# Patient Record
Sex: Male | Born: 1955 | Marital: Married | Smoking: Never smoker
Health system: Southern US, Community
[De-identification: ages and names within clinical notes are randomized; demographics above are authoritative.]

## PROBLEM LIST (undated history)

## (undated) HISTORY — PX: TONSILLECTOMY: SUR1361

## (undated) HISTORY — PX: APPENDECTOMY: SHX54

## (undated) HISTORY — PX: HERNIA REPAIR: SHX51

## (undated) HISTORY — PX: VASECTOMY: SHX75

## (undated) HISTORY — PX: KNEE SURGERY: SHX244

## (undated) HISTORY — PX: CHOLECYSTECTOMY: SHX55

---

## 2005-10-27 ENCOUNTER — Ambulatory Visit: Payer: Self-pay | Admitting: Internal Medicine

## 2005-12-16 ENCOUNTER — Inpatient Hospital Stay: Payer: Self-pay | Admitting: General Surgery

## 2005-12-16 ENCOUNTER — Other Ambulatory Visit: Payer: Self-pay

## 2006-11-24 IMAGING — US ABDOMEN ULTRASOUND
1 series · 17 of 25 positions shown · non-contrast
Comparison: none

REASON FOR EXAM: Pancreatitis
COMMENTS:

[Series 1: abdomen ultrasound · 17 of 140 slices shown]
[im 1/140]
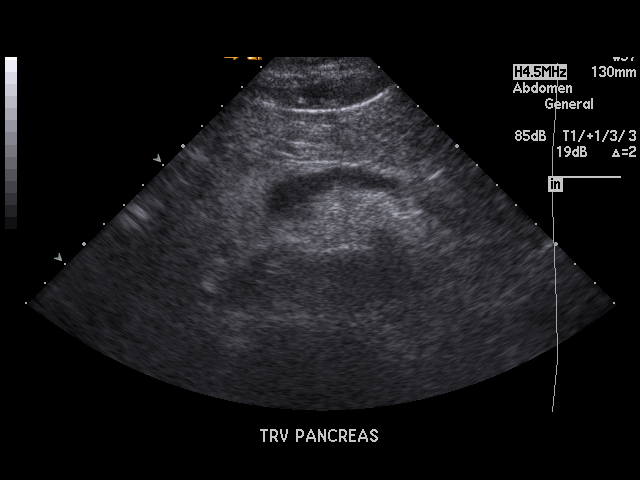
[im 12/140]
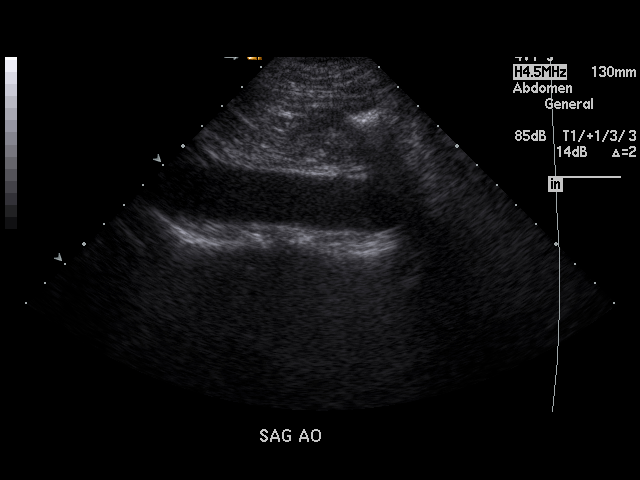
[im 18/140]
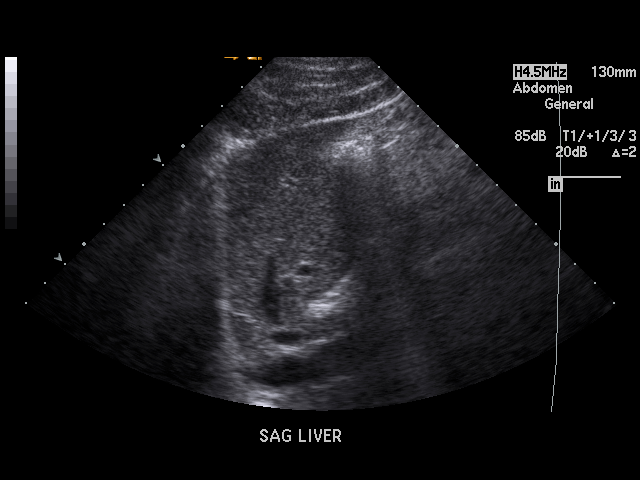
[im 29/140]
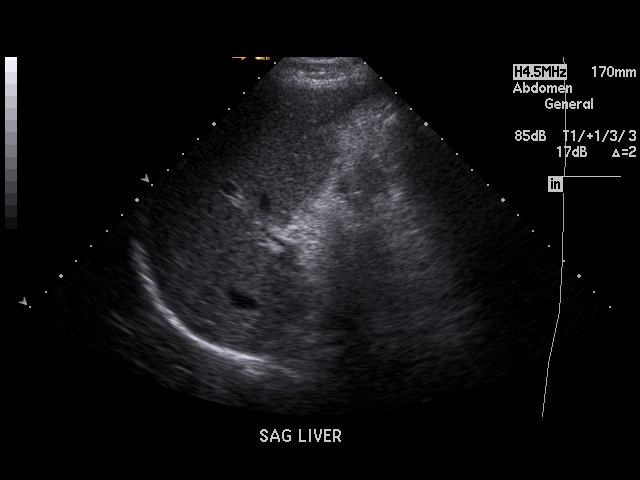
[im 35/140]
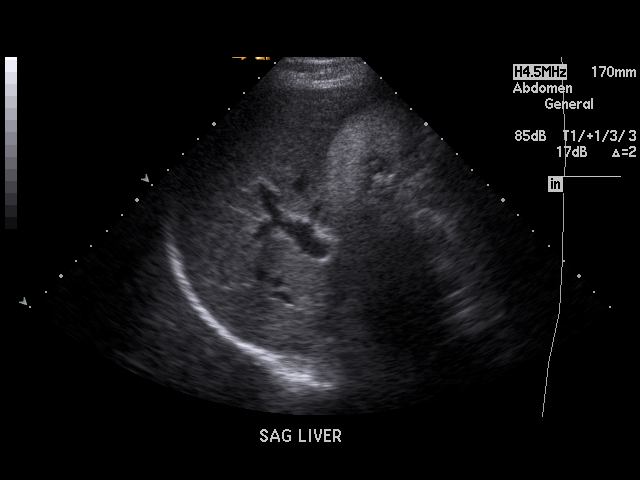
[im 47/140]
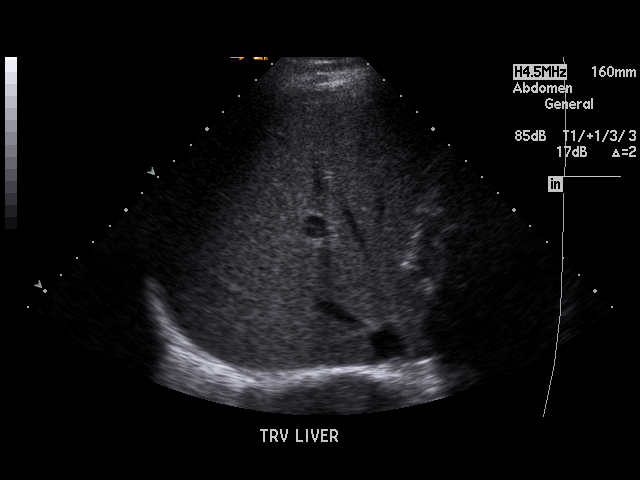
[im 53/140]
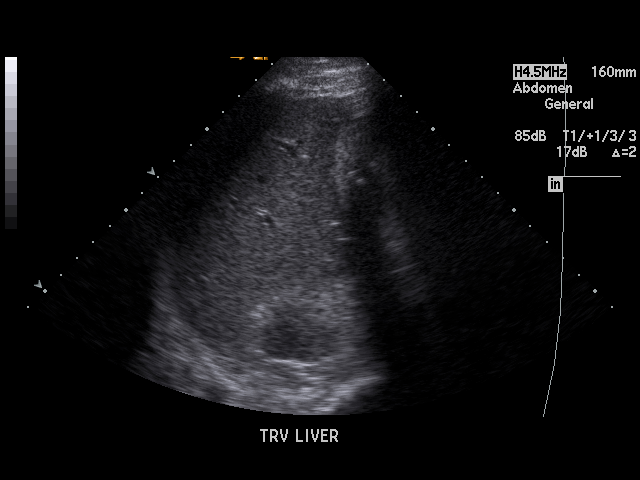
[im 64/140]
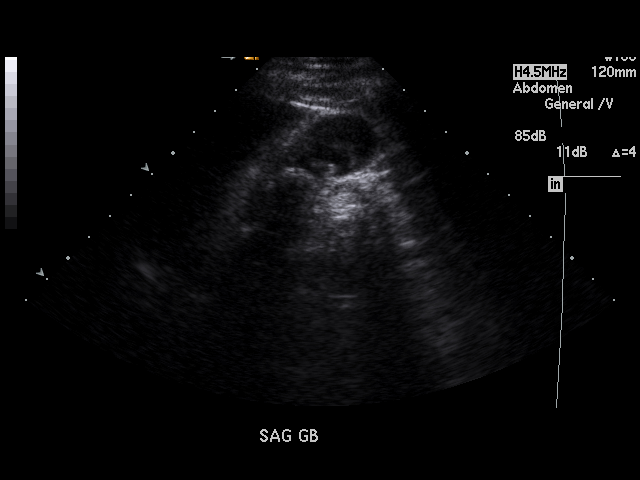
[im 70/140]
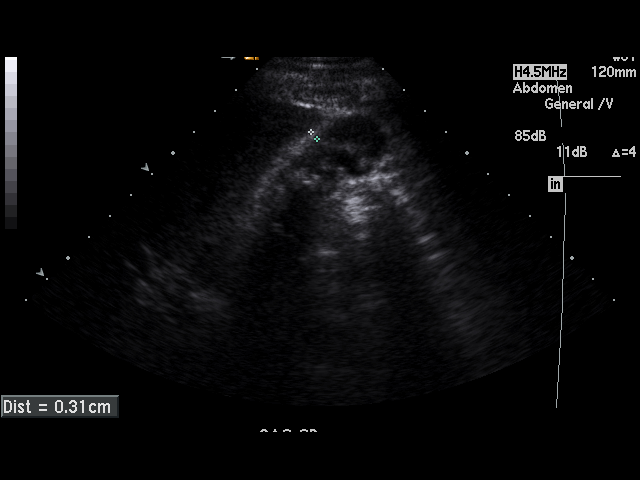
[im 76/140]
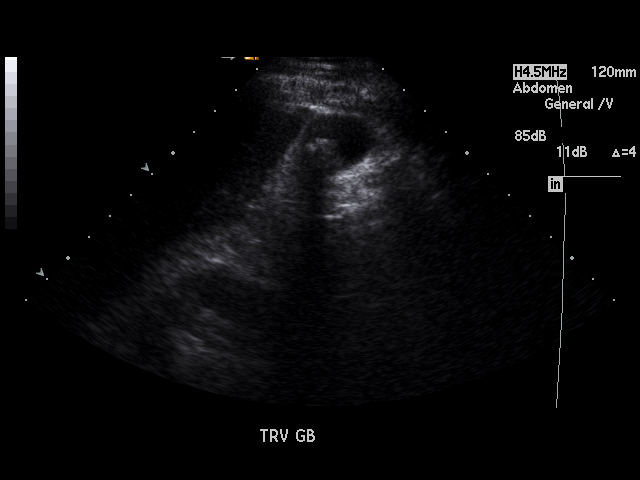
[im 87/140]
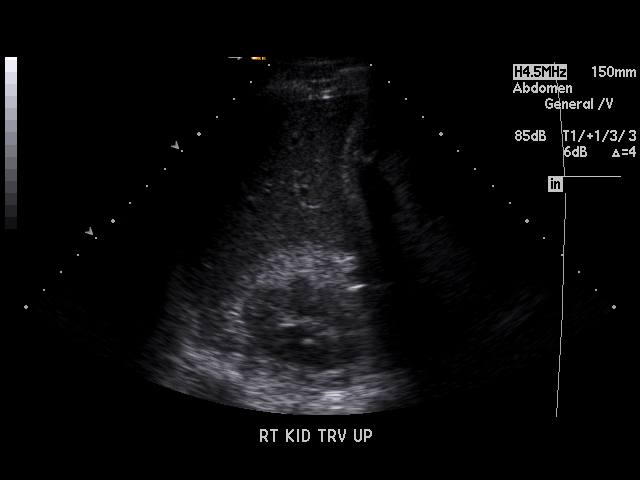
[im 93/140]
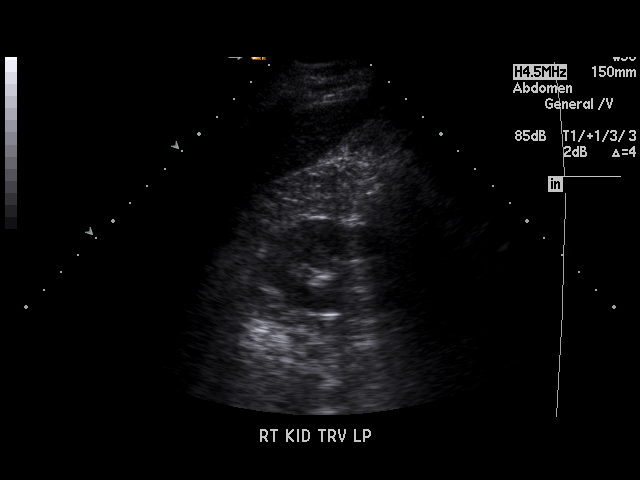
[im 105/140]
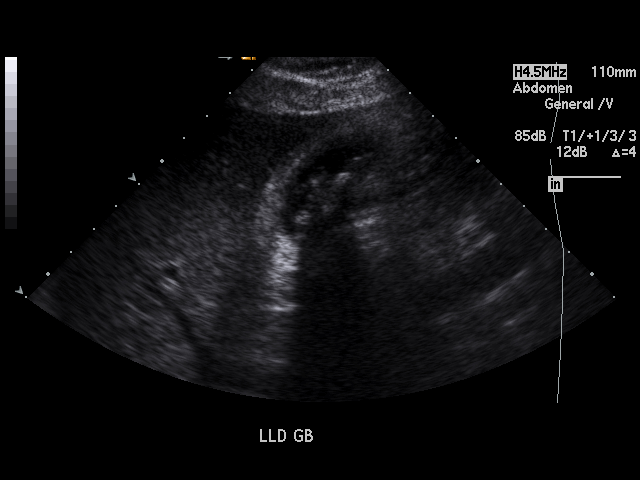
[im 111/140]
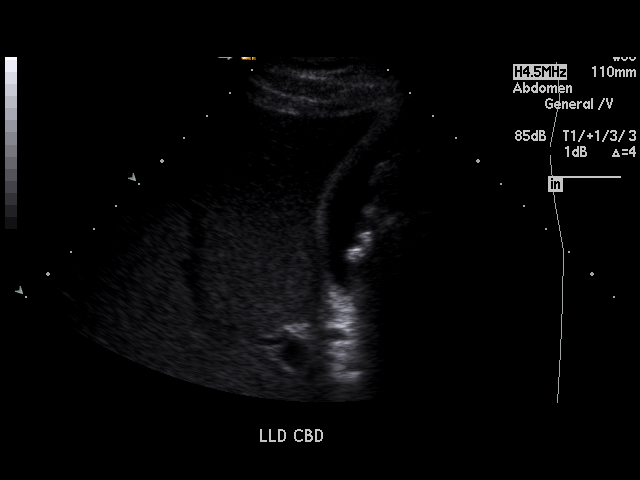
[im 122/140]
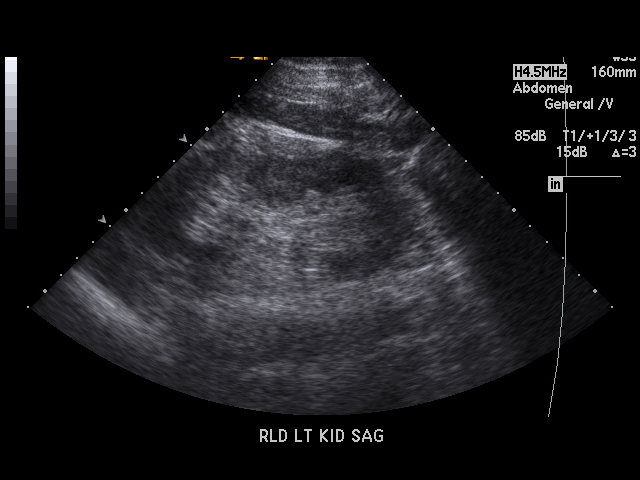
[im 128/140]
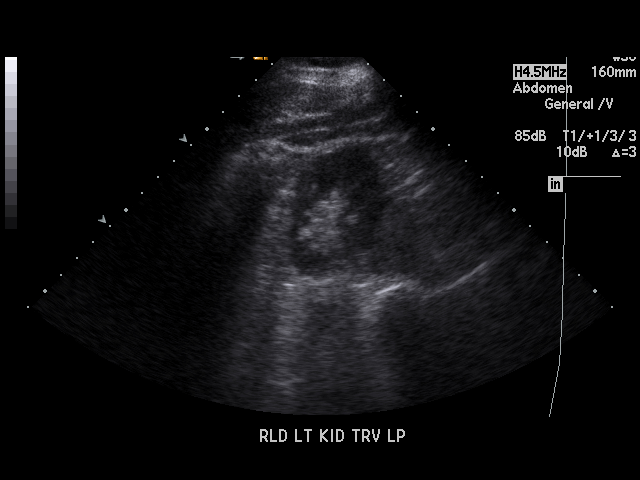
[im 140/140]
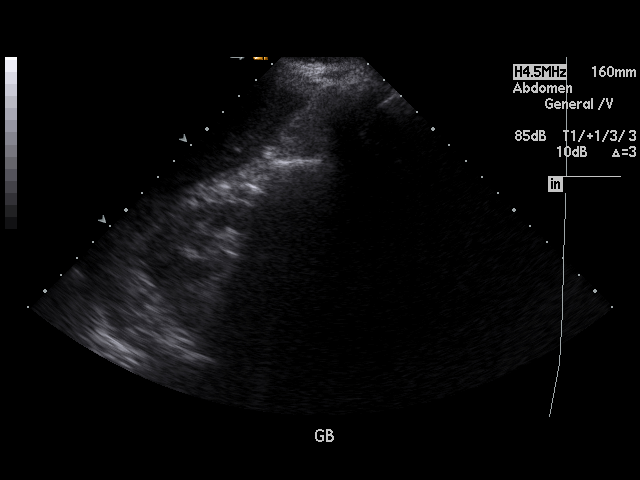

[17 of 25 positions shown; findings below may reference images not displayed]

PROCEDURE:     US  - US ABDOMEN GENERAL SURVEY  - December 18, 2005 [DATE]

RESULT:          The patient has known pancreatitis and is being evaluated
now for possible gallstones.

The pancreatic head appears normal in echotexture, measuring 1.9 cm in
diameter.  The observed portions of the pancreatic body are grossly normal,
but gas does limit the study.  The gallbladder is adequately distended and
contains echogenic mobile shadowing gallstones.  The gallbladder wall is
thickened at 3.1 mm.  No pericholecystic fluid is demonstrated.  The patient
did not exhibit a positive sonographic Murphy's sign.

The common bile duct is normal at 5.1 mm in diameter.  No stones are
identified within the duct.  The RIGHT kidney demonstrates an approximately
1.7 cm diameter simple-appearing cyst.  No hydronephrosis is seen on the
RIGHT.  The spleen is normal in size, measuring 10.8 cm in greatest
dimension.  Its echotexture is normal.  The LEFT kidney is normal in size
and echotexture.  No significant ascites is demonstrated.
IMPRESSION: 1.     There are gallstones present.  There is gallbladder wall thickening,
but I see no pericholecystic fluid, and there is no sonographic Murphy's
sign.
2.     The common bile duct is normal at 5.1 mm in diameter, and there is no
evidence of a stone within the duct.
3.     The evaluation of the pancreas is limited.  The pancreatic head is
grossly normal.
4.     There is a probable cyst in the mid pole of the RIGHT kidney.

The findings were called to Dr. Corpuz at the conclusion of the study.

## 2008-05-08 ENCOUNTER — Ambulatory Visit: Payer: Self-pay | Admitting: Gastroenterology

## 2008-11-03 ENCOUNTER — Ambulatory Visit: Payer: Self-pay | Admitting: Family Medicine

## 2009-03-31 DIAGNOSIS — M1909 Primary osteoarthritis, other specified site: Secondary | ICD-10-CM | POA: Insufficient documentation

## 2012-04-25 ENCOUNTER — Ambulatory Visit: Payer: Self-pay | Admitting: Family Medicine

## 2012-04-25 LAB — BASIC METABOLIC PANEL
BUN: 13 mg/dL (ref 4–21)
Creatinine: 1 mg/dL (ref 0.6–1.3)
GLUCOSE: 101 mg/dL
POTASSIUM: 3.9 mmol/L (ref 3.4–5.3)
Sodium: 140 mmol/L (ref 137–147)

## 2012-04-25 LAB — CBC AND DIFFERENTIAL
HEMATOCRIT: 44 % (ref 41–53)
HEMOGLOBIN: 14.7 g/dL (ref 13.5–17.5)
NEUTROS ABS: 4 /uL
PLATELETS: 256 10*3/uL (ref 150–399)
WBC: 6.9 10*3/mL

## 2012-04-25 LAB — HEPATIC FUNCTION PANEL
ALT: 21 U/L (ref 10–40)
AST: 20 U/L (ref 14–40)
Alkaline Phosphatase: 73 U/L (ref 25–125)
BILIRUBIN, TOTAL: 0.4 mg/dL

## 2012-04-25 LAB — TSH: TSH: 1.85 u[IU]/mL (ref 0.41–5.90)

## 2012-11-23 ENCOUNTER — Ambulatory Visit: Payer: Self-pay | Admitting: General Surgery

## 2012-11-23 LAB — CREATININE, SERUM
EGFR (African American): >60
EGFR (Non-African Amer.): >60

## 2013-10-30 IMAGING — CT CT ABD-PELV W/ CM
1 of 2 series · 15 of 32 positions shown, 19 images · non-contrast
Comparison: none

REASON FOR EXAM: LABS 1st Abdominal pain
COMMENTS:

PROCEDURE:     CT  - CT ABDOMEN / PELVIS  W  - November 23, 2012  [DATE]
RESULT:
TECHNIQUE: CT of the abdomen and pelvis is performed with 100 ml of
Dsovue-LPP iodinated intravenous contrast with oral contrast. Images are
reconstructed at 3.0 mm slice thickness in the axial plane.
There is no previous exam for comparison.

[Series 2: soft tissue · axial · 0.72mm/px · z∈[-1004,-528]mm · 15 of 173 slices shown, 19 images]
[im 7/173  soft-tissue]
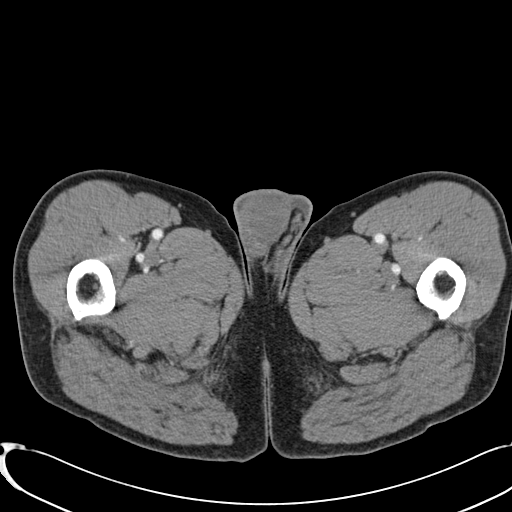
[im 7/173  bone]
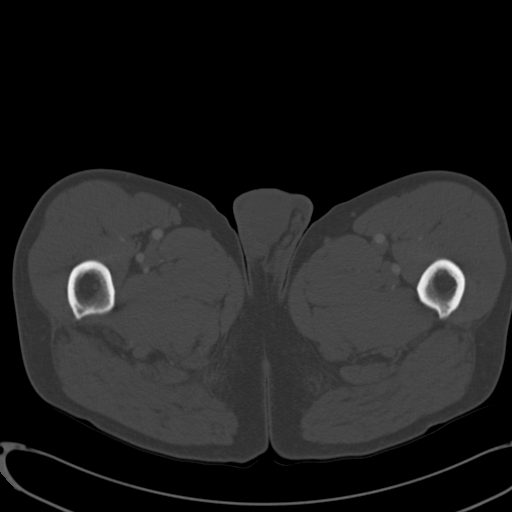
[im 21/173  soft-tissue]
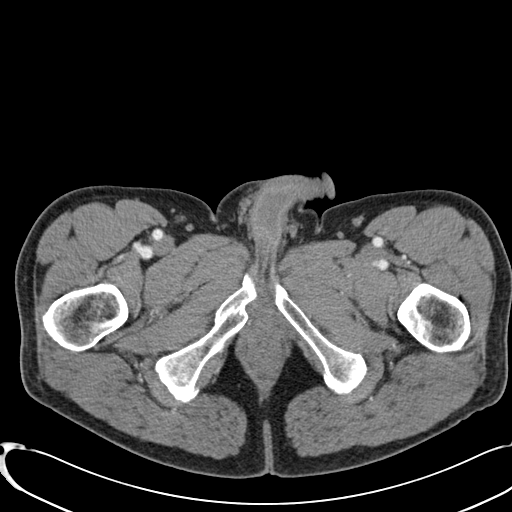
[im 35/173  soft-tissue]
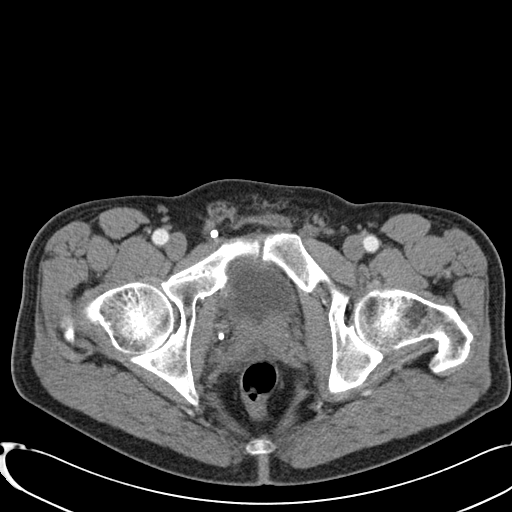
[im 49/173  soft-tissue]
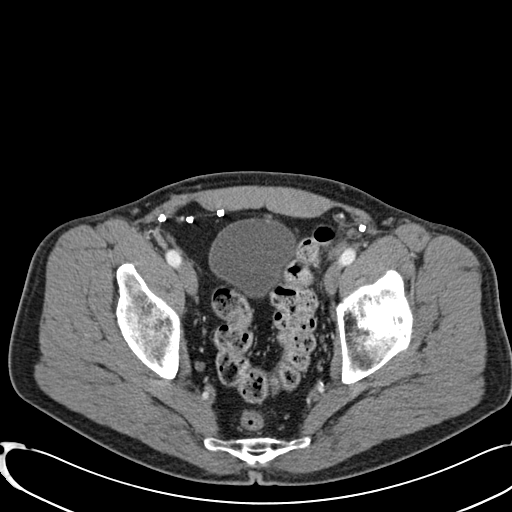
[im 62/173  soft-tissue]
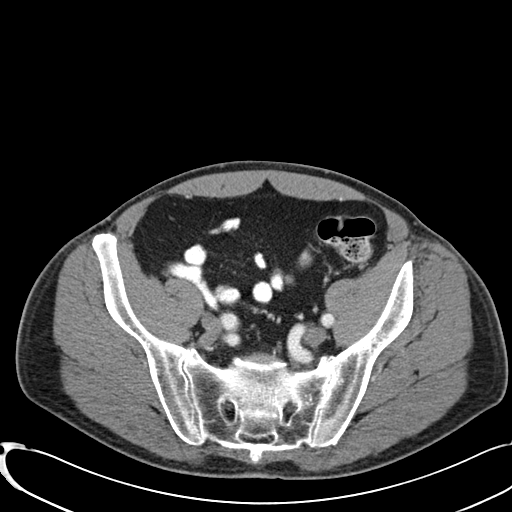
[im 76/173  soft-tissue]
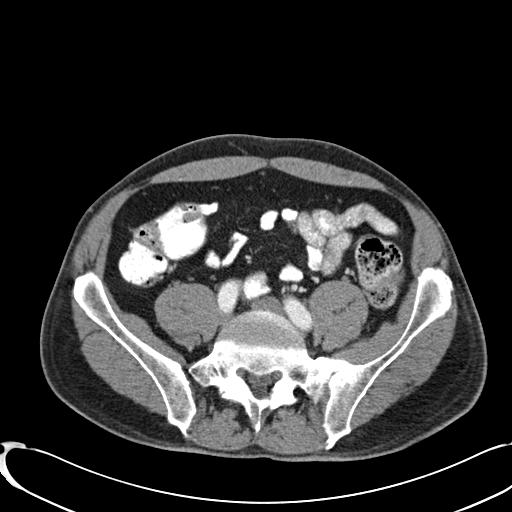
[im 90/173  soft-tissue]
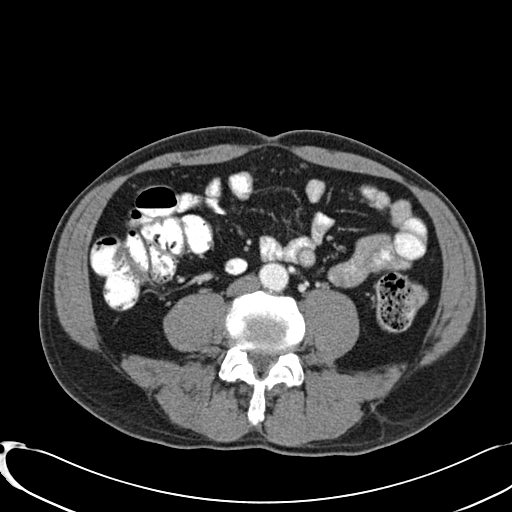
[im 97/173  soft-tissue]
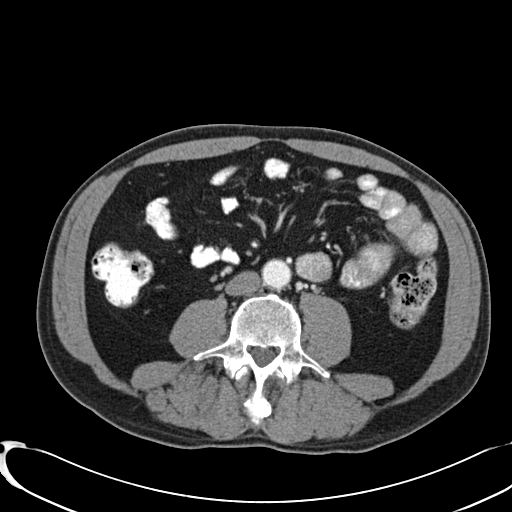
[im 111/173  soft-tissue]
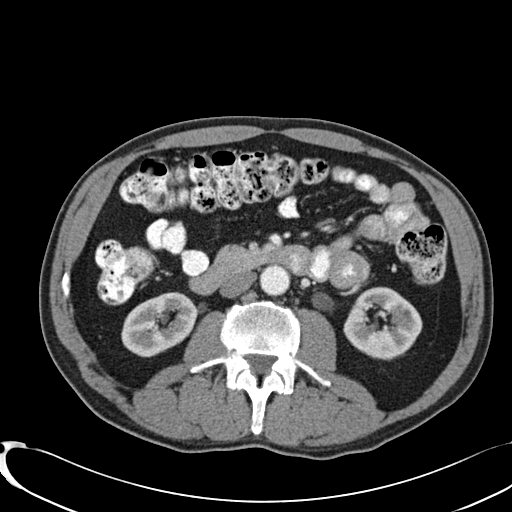
[im 111/173  bone]
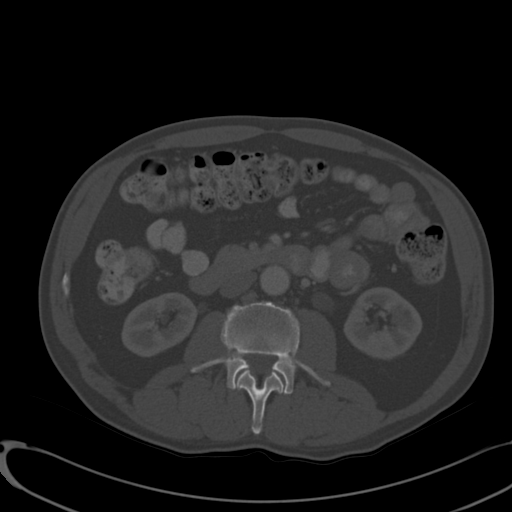
[im 124/173  soft-tissue]
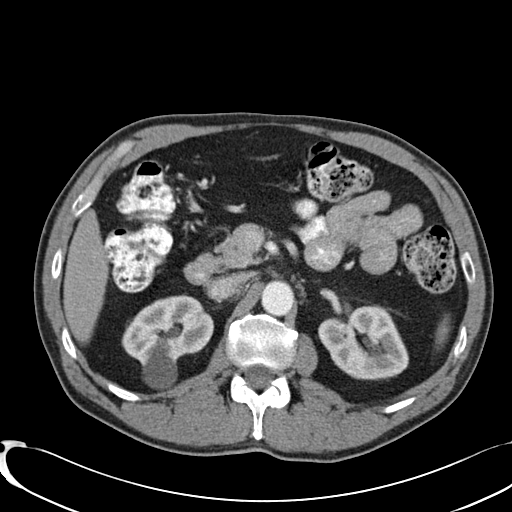
[im 138/173  soft-tissue]
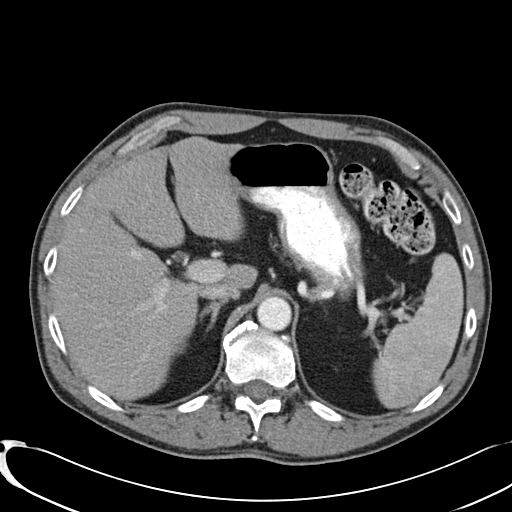
[im 145/173  lung]
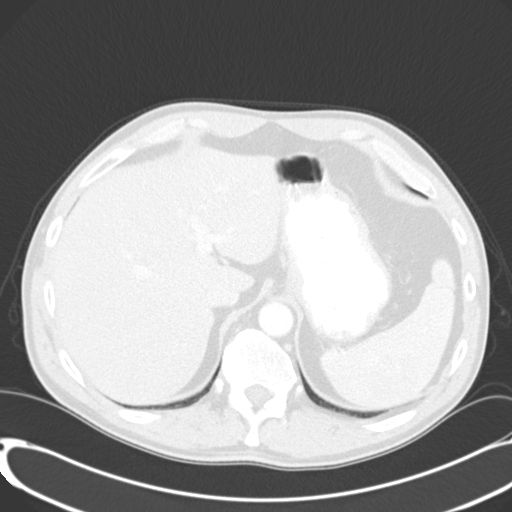
[im 152/173  soft-tissue]
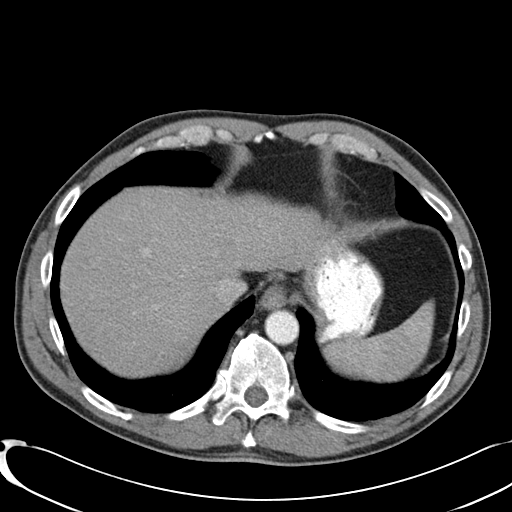
[im 152/173  lung]
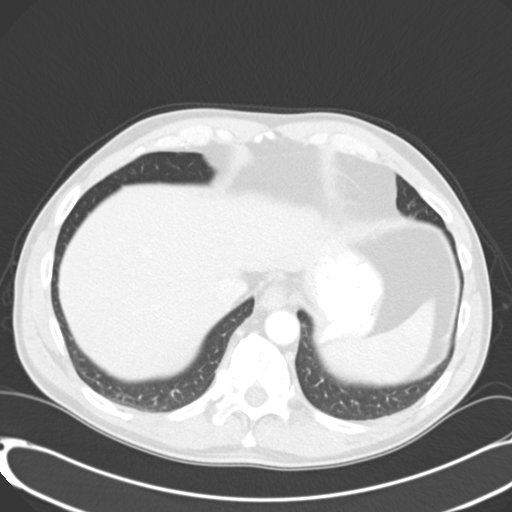
[im 159/173  lung]
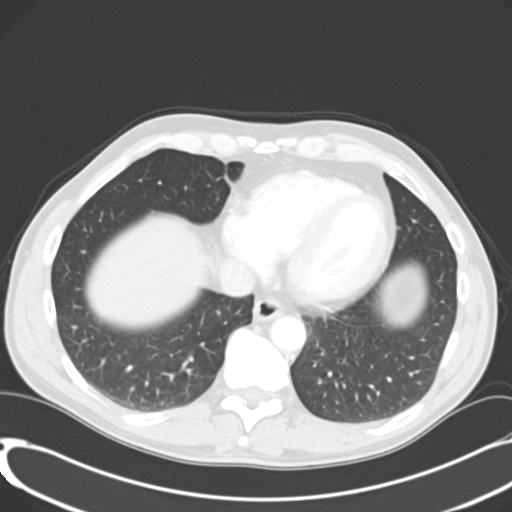
[im 166/173  soft-tissue]
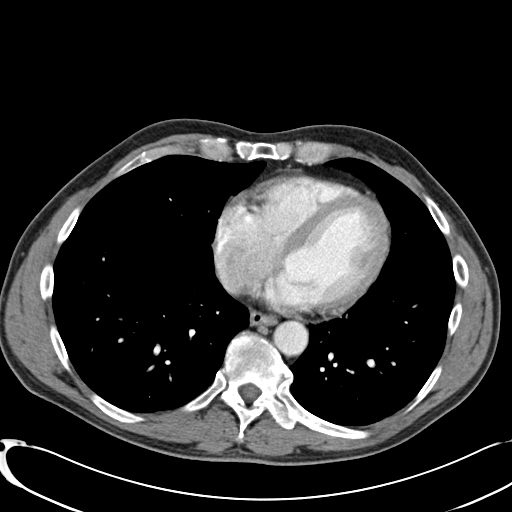
[im 166/173  lung]
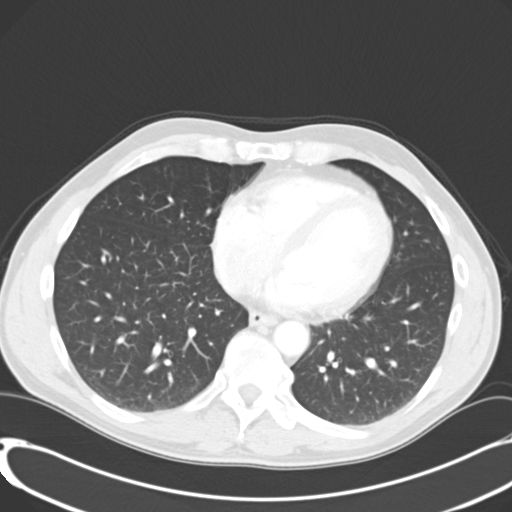

[15 of 32 positions shown; findings below may reference images not displayed]

FINDINGS: Lung base images demonstrate minimal dependent atelectasis. There
is no infiltrate, effusion or pneumothorax. The liver and spleen appear
unremarkable. Cholecystectomy clips are present. The adrenal glands appear
normal. There is a 3.1 cm cyst posteriorly in the upper to mid right kidney.
There is an extrarenal pelvis on the left. The abdominal aorta is normal in
caliber. The spleen and pancreas appear unremarkable. There is no abscess or
ascites. Some scattered colonic diverticulosis is seen. There is no acute
inflammatory process. There is no adenopathy. The urinary bladder and
prostate appear unremarkable. There is a history of appendectomy. Coils from
previous hernia repair are present. The bony structures appear within normal
limits. The abdominal wall appears intact. The stomach shows no wall
thickening or abnormal distention. There is a moderate amount of fecal
material scattered through the colon without obstruction. There is no
thickening of the wall or abnormal distention of the small or large bowel.
IMPRESSION: 1.     No acute abdominal abnormality. No bowel obstruction. No acute
inflammatory process. No adenopathy.
2.     Status post cholecystectomy and appendectomy.
3.     Minimal dependent atelectasis in the lung bases.

[REDACTED]

## 2016-05-24 DIAGNOSIS — Z86010 Personal history of colonic polyps: Secondary | ICD-10-CM | POA: Insufficient documentation

## 2016-05-24 DIAGNOSIS — M5030 Other cervical disc degeneration, unspecified cervical region: Secondary | ICD-10-CM | POA: Insufficient documentation

## 2020-04-12 ENCOUNTER — Other Ambulatory Visit: Payer: Self-pay

## 2020-04-12 ENCOUNTER — Ambulatory Visit
Admission: EM | Admit: 2020-04-12 | Discharge: 2020-04-12 | Disposition: A | Discharge status: Home / Self Care | Payer: BC Managed Care – PPO | Attending: Emergency Medicine | Admitting: Emergency Medicine

## 2020-04-12 ENCOUNTER — Encounter: Payer: Self-pay | Admitting: Emergency Medicine

## 2020-04-12 DIAGNOSIS — Z1839 Other retained organic fragments: Secondary | ICD-10-CM

## 2020-04-12 DIAGNOSIS — S20459A Superficial foreign body of unspecified back wall of thorax, initial encounter: Secondary | ICD-10-CM

## 2020-04-12 MED ORDER — DOXYCYCLINE HYCLATE 100 MG PO CAPS: 100.0000 mg | ORAL_CAPSULE | Freq: Two times a day (BID) | ORAL | 0 refills | Status: DC

## 2020-04-12 NOTE — ED Triage Notes (Signed)
Patient in today stating that he has a tick on his back x 3-4 days. Patient states he tried to remove yesterday, but did not get the head.

## 2020-04-12 NOTE — ED Provider Notes (Signed)
[facility] ____________________________________________  Time seen: Approximately 11:28 AM  I have reviewed the triage vital signs and the nursing notes.   HISTORY  Chief Complaint Tick Removal   HPI Carlos Hansen is a 64 y.o. male presenting for evaluation and removal of embedded tick.  Patient reports yesterday his wife was scratching his back and noticed he had a tick to his right back patient and states when they removed it the head stayed in.  Reports his family tried to remove it multiple times.  States he believes the tick was attached for 3 to 4 days prior to removal.  States area is mildly sore, denies other pain.  Denies drainage, fevers, rash, joint pains or recent sickness.  States otherwise feels well.  Denies other aggravating or alleviating factors.  Last tetanus immunization was administered in 2014.  PCP: [facility]   History reviewed. No pertinent past medical history.  Patient Active Problem List   Diagnosis Date Noted  . History of colonic polyps 05/24/2016  . Degeneration of cervical intervertebral disc 05/24/2016  . 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 Allergic rhinitis 11/05/2007    Past Surgical History:  Procedure Laterality Date  . APPENDECTOMY    . CHOLECYSTECTOMY    . HERNIA REPAIR    . KNEE SURGERY Right   . TONSILLECTOMY    . VASECTOMY       No current facility-administered medications for this encounter.  Current Outpatient Medications:  .  doxycycline (VIBRAMYCIN) 100 MG capsule, Take 1 capsule (100 mg total) by mouth 2 (two) times daily., Disp: 20 capsule, Rfl: 0 .  fluticasone (FLONASE) 50 MCG/ACT nasal spray, Place into the nose., Disp: , Rfl:  .  montelukast (SINGULAIR) 10 MG tablet, Take by mouth., Disp: , Rfl:   Allergies Penicillins, Sumatriptan, and Morphine  Family History  Problem Relation Age of Onset  . Brain cancer Mother   . Diabetes  Father   . Asthma Brother   . Asthma Brother     Social History Social History   Tobacco Use  . Smoking status: Never Smoker  . Smokeless tobacco: Never Used  Substance Use Topics  . Alcohol use: Not Currently  . Drug use: Never    Review of Systems Constitutional: No fever Cardiovascular: Denies chest pain. Respiratory: Denies shortness of breath. Gastrointestinal: No abdominal pain. Musculoskeletal: Negative for back pain. Skin: As above. Neurological: Negative for headaches.  ____________________________________________   PHYSICAL EXAM:  VITAL SIGNS: ED Triage Vitals [04/12/20 0940]  Enc Vitals Group     BP 139/89     Pulse Rate 64     Resp 18     Temp 97.8 F (36.6 C)     Temp Source Oral     SpO2 97 %     Weight 191 lb (86.6 kg)     Height 5\' 11"  (1.803 m)     Head Circumference      Peak Flow      Pain Score 0     Pain Loc      Pain Edu?      Excl. in [place]?     Constitutional: Alert and oriented. Well appearing and in no acute distress. Eyes: Conjunctivae are normal.  ENT      Head: Normocephalic and atraumatic. Cardiovascular: Good peripheral circulation. Respiratory: Normal respiratory effort without tachypnea nor retractions.  Musculoskeletal: Sedate Neurologic:  Normal speech and language.  Skin:  Skin  is warm, dry.  Except: Right posterior mid back small scab present, with mild surrounding erythema, cleaned and scab removal with small foreign body noted.  Foreign body removed as below, no further retained foreign body visualized, area minimally tender, no purulence, no other rash noted. Psychiatric: Mood and affect are normal. Speech and behavior are normal. Patient exhibits appropriate insight and judgment   ___________________________________________   LABS (all labs ordered are listed, but only abnormal results are displayed)  Labs Reviewed - No data to display ____________________________________________  PROCEDURES Procedures    Procedure(s) performed:  Procedure explained and verbal consent obtained. Consent: Verbal consent obtained. Written consent not obtained. Risks and benefits: risks, benefits and alternatives were discussed Patient identity confirmed: verbally with patient and hospital-assigned identification number  Consent given by: patient   Foreign body removal Location: Right back Preparation: Patient was prepped and draped in the usual sterile fashion. Anesthesia with 1% Lidocaine with epi 1 mL Cleaned with Betadine Irrigation solution: saline Irrigation method: jet lavage Amount of cleaning: copious Sterile scalpel utilized to remove small black foreign body.  No further retained foreign body noted. Patient tolerate well.dressing applied.  Wound care instructions provided.  Observe for any signs of infection or other problems.      INITIAL IMPRESSION / ASSESSMENT AND PLAN / ED COURSE  Pertinent labs & imaging results that were available during my care of the patient were reviewed by me and considered in my medical decision making (see chart for details).  Well-appearing patient.  Patient had tick to right back but was not removed in its entirety.  Small portion removed as above.  Patient tolerated well.  With mild surrounding erythema.  Will treat with doxycycline.  Keep clean, topical antibiotic ointment and monitor.  Discussed indication, risks and benefits of medications with patient. Discussed follow up and return parameters including no resolution or any worsening concerns. Patient verbalized understanding and agreed to plan.   ____________________________________________   FINAL CLINICAL IMPRESSION(S) / ED DIAGNOSES  Final diagnoses:  Embedded tick of upper back excluding scapular region, initial encounter     ED Discharge Orders         Ordered    doxycycline (VIBRAMYCIN) 100 MG capsule  2 times daily     04/12/20 1127           Note: This dictation was prepared with  Dragon dictation along with smaller phrase technology. Any transcriptional errors that result from this process are unintentional.         Renford Dills, NP 04/12/20 1247

## 2020-04-12 NOTE — Discharge Instructions (Signed)
Take medication as prescribed. Keep clean. Topical antibiotic ointment. Monitor.   Follow up with your primary care physician this week as needed. Return to Urgent care for new or worsening concerns.

## 2020-05-16 ENCOUNTER — Other Ambulatory Visit: Payer: Self-pay

## 2020-05-16 ENCOUNTER — Encounter: Payer: Self-pay | Admitting: Emergency Medicine

## 2020-05-16 ENCOUNTER — Ambulatory Visit
Admission: EM | Admit: 2020-05-16 | Discharge: 2020-05-16 | Disposition: A | Discharge status: Home / Self Care | Payer: BC Managed Care – PPO | Attending: Family Medicine | Admitting: Family Medicine

## 2020-05-16 DIAGNOSIS — W57XXXD Bitten or stung by nonvenomous insect and other nonvenomous arthropods, subsequent encounter: Secondary | ICD-10-CM

## 2020-05-16 DIAGNOSIS — L299 Pruritus, unspecified: Secondary | ICD-10-CM

## 2020-05-16 MED ORDER — TRIAMCINOLONE ACETONIDE 0.1 % EX CREA: 1.0000 "application " | TOPICAL_CREAM | Freq: Two times a day (BID) | CUTANEOUS | 0 refills | Status: DC

## 2020-05-16 MED ORDER — MUPIROCIN 2 % EX OINT: TOPICAL_OINTMENT | CUTANEOUS | 0 refills | Status: DC

## 2020-05-16 NOTE — Discharge Instructions (Addendum)
use medication as prescribed. Monitor.   Follow up with your primary care physician this week as needed. Return to Urgent care for new or worsening concerns.

## 2020-05-16 NOTE — ED Provider Notes (Signed)
[facility] ____________________________________________  Time seen: Approximately 12:19 PM  I have reviewed the triage vital signs and the nursing notes.   HISTORY  Chief Complaint Insect Bite (back)   HPI Carlos Hansen is a 64 y.o. male presenting for reevaluation of right upper back tick bite.  Patient was seen in urgent care on 04/12/2020 for right upper back embedded tick in which he had it removed.  Reports he then completed his doxycycline as prescribed.  He states he has been doing well.  States the redness that he initially had did resolve but continues with intermittent itching to the area.  States yesterday while he was working he had more itching to the right upper back area where the tick bite was and his family felt that he should have a get checked.  Denies any redness, drainage, rash, fever or other skin changes.  Denies any atypical headaches, atypical joint pains, chest pain or shortness of breath or recent sickness.  States he feels well, but intermittently itchy at the area.  Tetanus immunizations up-to-date.   History reviewed. No pertinent past medical history.  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  Current Outpatient Medications:    fluticasone (FLONASE) 50 MCG/ACT nasal spray, Place into the nose., Disp: , Rfl:    montelukast (SINGULAIR) 10 MG tablet, Take by mouth., Disp: , Rfl:    doxycycline (VIBRAMYCIN) 100 MG capsule, Take 1 capsule (100 mg  total) by mouth 2 (two) times daily., Disp: 20 capsule, Rfl: 0   mupirocin ointment (BACTROBAN) 2 %, Apply two times a day for 5 days., Disp: 22 g, Rfl: 0   triamcinolone cream (KENALOG) 0.1 %, Apply 1 application topically 2 (two) times daily. One week, Disp: 30 g, Rfl: 0  Allergies Penicillins, Sumatriptan, and Morphine  Family History  Problem Relation Age of Onset   Brain cancer Mother    Diabetes Father    Asthma Brother    Asthma Brother     Social History Social History   Tobacco Use   Smoking status: Never Smoker   Smokeless tobacco: Never Used  Substance Use Topics   Alcohol use: Not Currently   Drug use: Never    Review of Systems Constitutional: No fever/chills Eyes: No visual changes. ENT: No sore throat. Cardiovascular: Denies chest pain. Respiratory: Denies shortness of breath. Gastrointestinal: No abdominal pain.  Musculoskeletal: Negative for back pain. Skin: Positive skin changes.   ____________________________________________   PHYSICAL EXAM:  VITAL SIGNS: ED Triage Vitals  Enc Vitals Group     BP 05/16/20 1123 (!) 156/92     Pulse Rate 05/16/20 1123 63     Resp 05/16/20 1123 16     Temp 05/16/20 1123 98.4 F (36.9 C)     Temp Source 05/16/20 1123 Oral     SpO2 05/16/20 1123 96 %     Weight 05/16/20 1121 196 lb (88.9 kg)     Height 05/16/20 1121 5\' 11"  (  1.803 m)     Head Circumference --      Peak Flow --      Pain Score 05/16/20 1121 0     Pain Loc --      Pain Edu? --      Excl. in [place]? --     Constitutional: Alert and oriented. Well appearing and in no acute distress. Eyes: Conjunctivae are normal.  ENT      Head: Normocephalic and atraumatic. Respiratory: Normal respiratory effort without tachypnea nor retractions.  Musculoskeletal: Steady gait. Neurologic:  Normal speech and language. Speech is normal. No gait instability.  Skin:  Skin is warm, dry.  Except: Right upper back shoulder blade area 0.5 cm flesh tone  papule, no foreign body noted, no fluctuance, pruritic, no surrounding erythema, nontender. Psychiatric: Mood and affect are normal. Speech and behavior are normal. Patient exhibits appropriate insight and judgment   ___________________________________________   LABS (all labs ordered are listed, but only abnormal results are displayed)  Labs Reviewed - No data to display  PROCEDURES Procedures    INITIAL IMPRESSION / ASSESSMENT AND PLAN / ED COURSE  Pertinent labs & imaging results that were available during my care of the patient were reviewed by me and considered in my medical decision making (see chart for details).  Well-appearing patient.  No acute distress.  Patient recently had embedded tick to right upper back which was removed, patient today with pruritus to the same area.  Area does not appear to be infected, and appears to be healing well, however is still pruritic, will treat with topical Bactroban and triamcinolone.  Monitor.  Discussed follow up and return parameters including no resolution or any worsening concerns. Patient verbalized understanding and agreed to plan.   ____________________________________________   FINAL CLINICAL IMPRESSION(S) / ED DIAGNOSES  Final diagnoses:  Tick bite, subsequent encounter  Pruritus     ED Discharge Orders         Ordered    triamcinolone cream (KENALOG) 0.1 %  2 times daily     05/16/20 1135    mupirocin ointment (BACTROBAN) 2 %     05/16/20 1135           Note: This dictation was prepared with Dragon dictation along with smaller phrase technology. Any transcriptional errors that result from this process are unintentional.         Marylene Land, NP 05/16/20 1301

## 2020-05-16 NOTE — ED Triage Notes (Signed)
Patient states that he was seen on 04/12/20 for tick bite to his upper back.  Patient states that he finished his antibiotic.  Patient states that the site is still red and tender.

## 2022-01-11 ENCOUNTER — Other Ambulatory Visit: Payer: Self-pay

## 2022-01-11 ENCOUNTER — Ambulatory Visit
Admission: RE | Admit: 2022-01-11 | Discharge: 2022-01-11 | Disposition: A | Discharge status: Home / Self Care | Payer: Medicare Other | Attending: Family Medicine | Admitting: Family Medicine

## 2022-01-11 ENCOUNTER — Other Ambulatory Visit: Payer: Self-pay | Admitting: Family Medicine

## 2022-01-11 ENCOUNTER — Ambulatory Visit
Admission: RE | Admit: 2022-01-11 | Discharge: 2022-01-11 | Disposition: A | Discharge status: Home / Self Care | Payer: Medicare Other | Source: Ambulatory Visit | Attending: Family Medicine | Admitting: Family Medicine

## 2022-01-11 DIAGNOSIS — R0600 Dyspnea, unspecified: Secondary | ICD-10-CM

## 2023-03-16 ENCOUNTER — Ambulatory Visit: Payer: BC Managed Care – PPO

## 2023-03-16 DIAGNOSIS — K5730 Diverticulosis of large intestine without perforation or abscess without bleeding: Secondary | ICD-10-CM | POA: Diagnosis not present

## 2023-03-16 DIAGNOSIS — K640 First degree hemorrhoids: Secondary | ICD-10-CM | POA: Diagnosis not present

## 2023-03-16 DIAGNOSIS — D123 Benign neoplasm of transverse colon: Secondary | ICD-10-CM | POA: Diagnosis not present

## 2023-03-16 DIAGNOSIS — Z86010 Personal history of colonic polyps: Secondary | ICD-10-CM | POA: Diagnosis not present

## 2024-02-13 ENCOUNTER — Other Ambulatory Visit: Payer: Self-pay | Admitting: General Surgery

## 2024-02-13 DIAGNOSIS — R1031 Right lower quadrant pain: Secondary | ICD-10-CM

## 2024-02-19 ENCOUNTER — Ambulatory Visit
Admission: RE | Admit: 2024-02-19 | Discharge: 2024-02-19 | Disposition: A | Discharge status: Home / Self Care | Source: Ambulatory Visit | Attending: General Surgery | Admitting: General Surgery

## 2024-02-19 DIAGNOSIS — R1031 Right lower quadrant pain: Secondary | ICD-10-CM | POA: Diagnosis present

## 2024-02-22 ENCOUNTER — Ambulatory Visit
Admission: EM | Admit: 2024-02-22 | Discharge: 2024-02-22 | Disposition: A | Discharge status: Home / Self Care | Attending: Emergency Medicine | Admitting: Emergency Medicine

## 2024-02-22 DIAGNOSIS — R051 Acute cough: Secondary | ICD-10-CM | POA: Insufficient documentation

## 2024-02-22 DIAGNOSIS — J302 Other seasonal allergic rhinitis: Secondary | ICD-10-CM | POA: Insufficient documentation

## 2024-02-22 LAB — RESP PANEL BY RT-PCR (FLU A&B, COVID) ARPGX2
Influenza A by PCR: NEGATIVE
Influenza B by PCR: NEGATIVE
SARS Coronavirus 2 by RT PCR: NEGATIVE

## 2024-02-22 MED ORDER — PROMETHAZINE-DM 6.25-15 MG/5ML PO SYRP: 5.0000 mL | ORAL_SOLUTION | Freq: Four times a day (QID) | ORAL | 0 refills | Status: AC | PRN

## 2024-02-22 MED ORDER — FLUTICASONE PROPIONATE 50 MCG/ACT NA SUSP: 2.0000 | Freq: Every day | NASAL | 1 refills | Status: AC

## 2024-02-22 MED ORDER — BENZONATATE 100 MG PO CAPS: 200.0000 mg | ORAL_CAPSULE | Freq: Three times a day (TID) | ORAL | 0 refills | Status: AC

## 2024-02-22 MED ORDER — FEXOFENADINE HCL 180 MG PO TABS
180.0000 mg | ORAL_TABLET | Freq: Every day | ORAL | 1 refills | Status: AC
Start: 2024-02-22 — End: ?

## 2024-02-22 NOTE — ED Triage Notes (Signed)
 Pt c/o allergies due to pollen  Pt states that he has had coughing x2days that has affected sleep  Pt states that he has had the same cough 2 years ago due to allergies.   Pt declines a covid/flu test

## 2024-02-22 NOTE — ED Provider Notes (Signed)
 [facility]    CSN: [id number] Arrival date & time: 02/22/24  0907      History   Chief Complaint Chief Complaint  Patient presents with   Allergies   Cough    HPI Carlos Hansen is a 68 y.o. male.   HPI  68 year old male with past medical history significant for allergic rhinitis, cervical generative disc disease, and ED presents for evaluation of what he thinks is allergy symptoms.  He reports that he has had chills as well as runny nose, nasal congestion, and a cough that is productive for yellow sputum.  He denies fever, sore throat, shortness breath, or wheezing.  Patient denied COVID and flu testing at triage.  History reviewed. No pertinent past medical history.  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 Authorizing Provider  amLODipine (NORVASC) 5 MG tablet Take by mouth. 10/30/23  Yes [provider]  benzonatate (TESSALON) 100 MG capsule Take 2 capsules (200 mg total) by mouth every 8 (eight) hours. 02/22/24  Yes Becky Augusta, NP  fexofenadine (ALLEGRA) 180 MG tablet Take 1 tablet (180 mg total) by mouth daily. 02/22/24  Yes Becky Augusta, NP  hydrochlorothiazide (HYDRODIURIL) 12.5 MG tablet Take by mouth. 01/23/24 01/22/25 Yes [provider]  promethazine-dextromethorphan (PROMETHAZINE-DM) 6.25-15 MG/5ML syrup Take 5 mLs by mouth 4 (four) times daily as needed. 02/22/24  Yes Becky Augusta, NP  fluticasone [facility]) 50 MCG/ACT  nasal spray Place 2 sprays into both nostrils daily. 02/22/24   Becky Augusta, NP    Family History Family History  Problem Relation Age of Onset   Brain cancer Mother    Diabetes Father    Asthma Brother    Asthma Brother     Social History Social History   Tobacco Use   Smoking status: Never   Smokeless tobacco: Never  Vaping Use   Vaping status: Never Used  Substance Use Topics   Alcohol use: Not Currently   Drug use: Never     Allergies   Penicillins, Sumatriptan, and Morphine   Review of Systems Review of Systems  Constitutional:  Positive for chills. Negative for fever.  HENT:  Positive for congestion and rhinorrhea. Negative for ear pain and sore throat.   Respiratory:  Positive for cough. Negative for shortness of breath and wheezing.      Physical Exam Triage Vital Signs ED Triage Vitals [02/22/24 0919]  Encounter Vitals Group     BP      Systolic BP Percentile      Diastolic BP Percentile      Pulse      Resp      Temp      Temp src      SpO2      Weight      Height      Head Circumference      Peak Flow  Pain Score 0     Pain Loc      Pain Education      Exclude from Growth Chart    No data found.  Updated Vital Signs BP (P) 121/68 (BP Location: Left Arm)   Pulse (P) 68   Temp (P) 97.9 F (36.6 C) (Oral)   Resp (P) 12   Ht 5\' 11"  (1.803 m)   Wt 202 lb (91.6 kg)   SpO2 (P) 98%   BMI 28.17 kg/m   Visual Acuity Right Eye Distance:   Left Eye Distance:   Bilateral Distance:    Right Eye Near:   Left Eye Near:    Bilateral Near:     Physical Exam Vitals and nursing note reviewed.  Constitutional:      Appearance: Normal appearance. He is not ill-appearing.  HENT:     Head: Normocephalic and atraumatic.     Right Ear: Tympanic membrane, ear canal and external ear normal. There is no impacted cerumen.     Left Ear: Tympanic membrane, ear canal and external ear normal. There is no impacted cerumen.     Nose: Congestion and  rhinorrhea present.     Comments: Mucosa is edematous and erythematous with scant clear discharge in both nares.    Mouth/Throat:     Mouth: Mucous membranes are moist.     Pharynx: Oropharynx is clear. Posterior oropharyngeal erythema present. No oropharyngeal exudate.     Comments: Mild erythema to the posterior pharynx with clear postnasal drip. Cardiovascular:     Rate and Rhythm: Normal rate.     Pulses: Normal pulses.     Heart sounds: Normal heart sounds. No murmur heard.    No friction rub. No gallop.  Pulmonary:     Effort: Pulmonary effort is normal.     Breath sounds: Normal breath sounds. No wheezing, rhonchi or rales.  Musculoskeletal:     Cervical back: Normal range of motion and neck supple. No tenderness.  Lymphadenopathy:     Cervical: No cervical adenopathy.  Skin:    General: Skin is warm and dry.     Capillary Refill: Capillary refill takes less than 2 seconds.     Findings: No rash.  Neurological:     General: No focal deficit present.     Mental Status: He is alert and oriented to person, place, and time.      UC Treatments / Results  Labs (all labs ordered are listed, but only abnormal results are displayed) Labs Reviewed  RESP PANEL BY RT-PCR (FLU A&B, COVID) [id number]    EKG   Radiology No results found.  Procedures Procedures (including critical care time)  Medications Ordered in UC Medications - No data to display  Initial Impression / Assessment and Plan / UC Course  I have reviewed the triage vital signs and the nursing notes.  Pertinent labs & imaging results that were available during my care of the patient were reviewed by me and considered in my medical decision making (see chart for details).   Patient is a pleasant, nontoxic-appearing 68 year old male presenting for evaluation of respiratory symptoms outlined in HPI above.  The patient reports that he feels this is allergies as the same symptoms developed every year and he has been  dealing with this for the last 40 years.  He typically takes generic Zyrtec but he has not been taking it lately and he thinks this is why his symptoms are developing.  Of note, is that the patient  has had chills.  He denies any documented fever.  He does have inflammation of his upper respiratory tract with clear rhinorrhea and erythema to the posterior pharynx with clear postnasal drip.  His cardiopulmonary exam physical lung sounds in all fields.  Differential diagnose include allergic rhinitis, COVID, influenza, viral respiratory illness.  I did advise the patient that we should rule him out for COVID or influenza prior to treating him for possible allergic rhinitis and he is consenting.  I will order a COVID and flu PCR.  Respiratory panel is negative for COVID influenza.  Discharge patient with a diagnosis of allergic rhinitis and start him on fluticasone 2 squirts at bedtime up each nostril to help with nasal allergy symptoms and also fexofenadine 180 mg once daily.  Additionally, I will prescribe Tessalon Perles and Promethazine DM cough syrup for cough and congestion.   Final Clinical Impressions(s) / UC Diagnoses   Final diagnoses:  Seasonal allergic rhinitis, unspecified trigger  Acute cough     Discharge Instructions      Your testing today was negative for COVID or influenza.  I am going to treat you for allergic rhinitis.  Start taking the Allegra 180 mg once daily to help control your allergy symptoms.  Resume using your Flonase at bedtime.  2 squirts in each nostril to control nasal allergy symptoms.  Aim the nozzle away from your septum to prevent nosebleeds.  Use the Tessalon Perles every 8 hours during the day as needed for cough.  Take them with a small sip of water.  They may give you numbness to the base of your tongue, or metallic taste in mouth, this is normal.  Use the Promethazine DM cough syrup at bedtime as needed for cough and congestion.  Performing sinus  irrigation once or twice a day to help remove pollen particles and environmental irritants may help your allergic rhinitis.     ED Prescriptions     Medication Sig Dispense Auth. Provider   fluticasone (FLONASE) 50 MCG/ACT nasal spray Place 2 sprays into both nostrils daily. 18.2 mL Becky Augusta, NP   fexofenadine (ALLEGRA) 180 MG tablet Take 1 tablet (180 mg total) by mouth daily. 30 tablet Becky Augusta, NP   benzonatate (TESSALON) 100 MG capsule Take 2 capsules (200 mg total) by mouth every 8 (eight) hours. 21 capsule Becky Augusta, NP   promethazine-dextromethorphan (PROMETHAZINE-DM) 6.25-15 MG/5ML syrup Take 5 mLs by mouth 4 (four) times daily as needed. 118 mL Becky Augusta, NP      PDMP not reviewed this encounter.   Becky Augusta, NP 02/22/24 1039

## 2024-02-22 NOTE — Discharge Instructions (Addendum)
 Your testing today was negative for COVID or influenza.  I am going to treat you for allergic rhinitis.  Start taking the Allegra 180 mg once daily to help control your allergy symptoms.  Resume using your Flonase at bedtime.  2 squirts in each nostril to control nasal allergy symptoms.  Aim the nozzle away from your septum to prevent nosebleeds.  Use the Tessalon Perles every 8 hours during the day as needed for cough.  Take them with a small sip of water.  They may give you numbness to the base of your tongue, or metallic taste in mouth, this is normal.  Use the Promethazine DM cough syrup at bedtime as needed for cough and congestion.  Performing sinus irrigation once or twice a day to help remove pollen particles and environmental irritants may help your allergic rhinitis.
